# Patient Record
Sex: Male | Born: 1990 | Race: Black or African American | Hispanic: No | Marital: Single | Smoking: Current every day smoker
Health system: Southern US, Community
[De-identification: ages and names within clinical notes are randomized; demographics above are authoritative.]

---
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
## 2013-02-25 ENCOUNTER — Emergency Department: Payer: Self-pay | Admitting: Emergency Medicine

## 2013-04-27 ENCOUNTER — Emergency Department: Payer: Self-pay | Admitting: Internal Medicine

## 2014-01-05 ENCOUNTER — Emergency Department: Payer: Self-pay | Admitting: Emergency Medicine

## 2014-01-05 LAB — BASIC METABOLIC PANEL
ANION GAP: 6 — AB (ref 7–16)
BUN: 7 mg/dL (ref 7–18)
CO2: 29 mmol/L (ref 21–32)
Calcium, Total: 9.2 mg/dL (ref 8.5–10.1)
Chloride: 102 mmol/L (ref 98–107)
Creatinine: 1.1 mg/dL (ref 0.60–1.30)
EGFR (African American): >60
Glucose: 89 mg/dL (ref 65–99)
Osmolality: 271 (ref 275–301)
Potassium: 3.8 mmol/L (ref 3.5–5.1)
Sodium: 137 mmol/L (ref 136–145)

## 2014-01-05 LAB — CBC
HCT: 48.6 % (ref 40.0–52.0)
HGB: 16.7 g/dL (ref 13.0–18.0)
MCH: 31.8 pg (ref 26.0–34.0)
MCHC: 34.2 g/dL (ref 32.0–36.0)
MCV: 93 fL (ref 80–100)
PLATELETS: 222 10*3/uL (ref 150–440)
RBC: 5.24 10*6/uL (ref 4.40–5.90)
RDW: 13.2 % (ref 11.5–14.5)
WBC: 9.3 10*3/uL (ref 3.8–10.6)

## 2014-01-08 LAB — BETA STREP CULTURE(ARMC)

## 2014-11-04 IMAGING — CT CT NECK WITH CONTRAST
4 of 5 series · 16 of 33 positions shown, 18 images · IV contrast (isovue)
Comparison: None.

CLINICAL DATA: Sore throat and trismus.

EXAM:
CT NECK WITH CONTRAST
TECHNIQUE: Multidetector CT imaging of the neck was performed using the
standard protocol following the bolus administration of intravenous
contrast.
CONTRAST:  100 cc Isovue 370 intravenous

[Series 2: axial neck · axial · 0.44mm/px · z∈[-184,-26]mm · 4 of 133 slices shown, 5 images]
[im 27/133  soft-tissue]
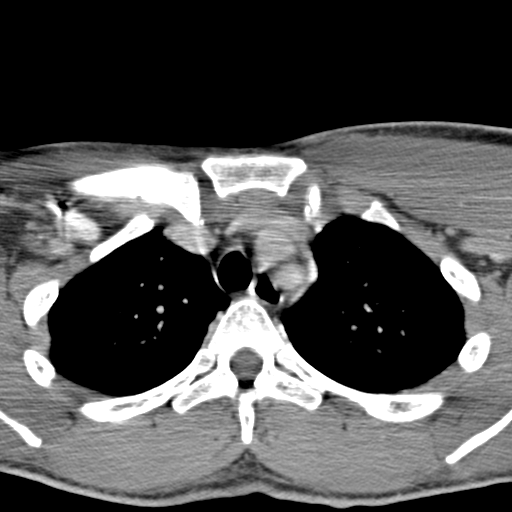
[im 27/133  bone]
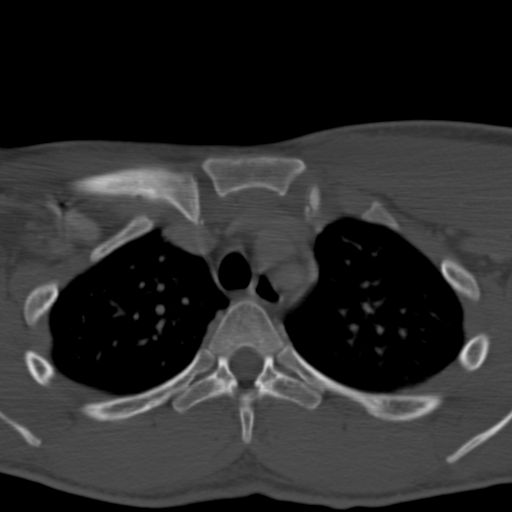
[im 53/133  bone]
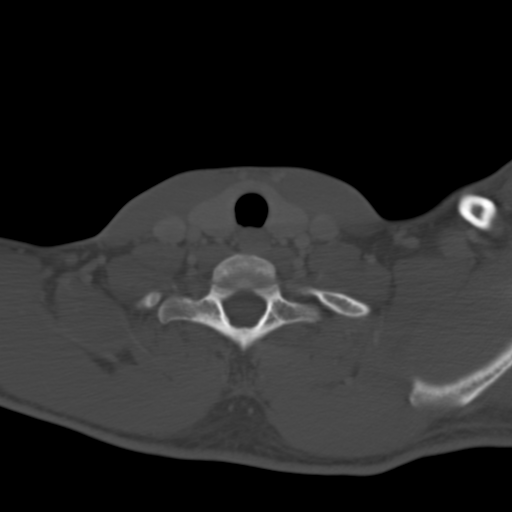
[im 80/133  bone]
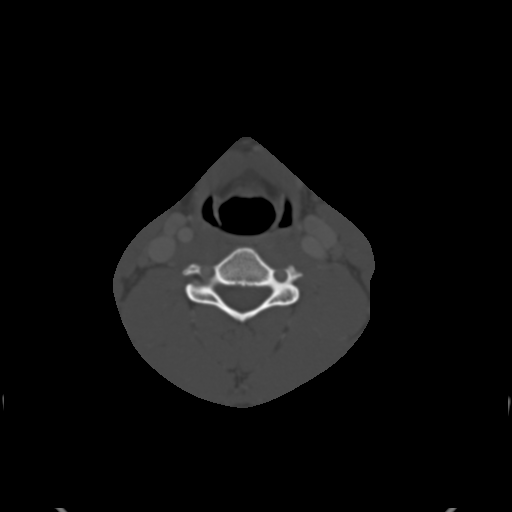
[im 106/133  bone]
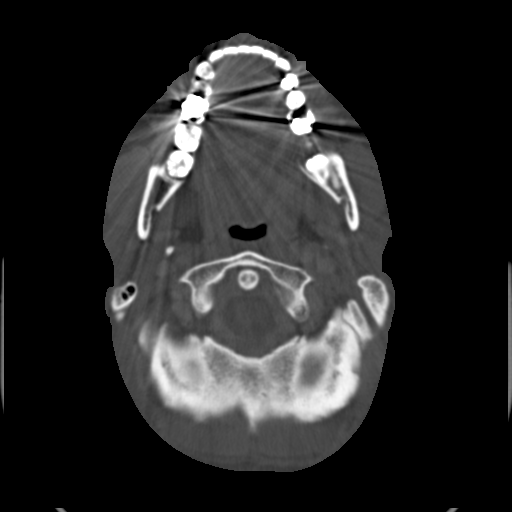

[Series 4: sag neck · sagittal · 0.52mm/px · 5 of 79 slices shown, 6 images]
[im 27/79  bone]
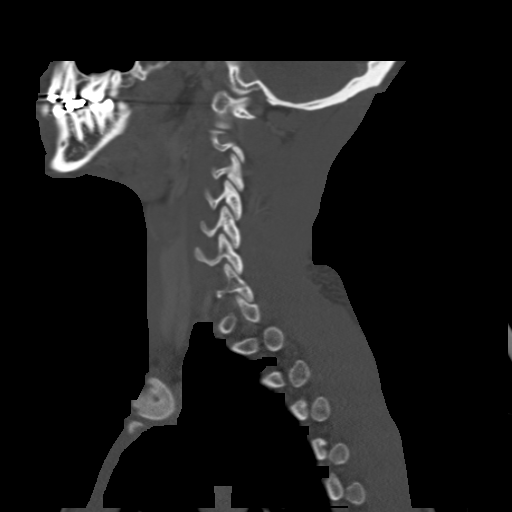
[im 33/79  bone]
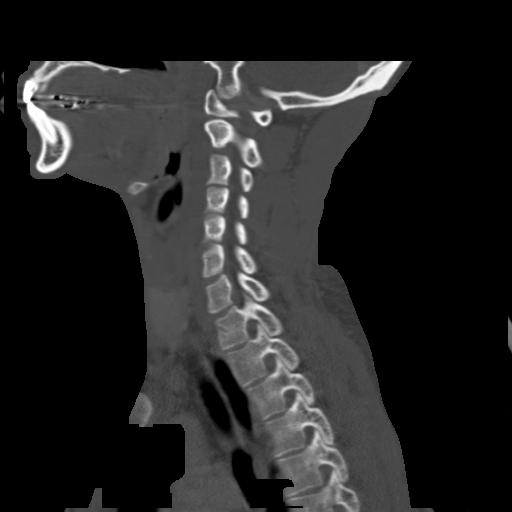
[im 40/79  soft-tissue]
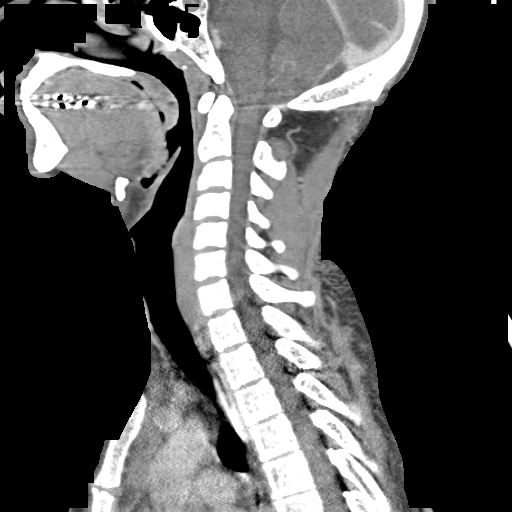
[im 40/79  bone]
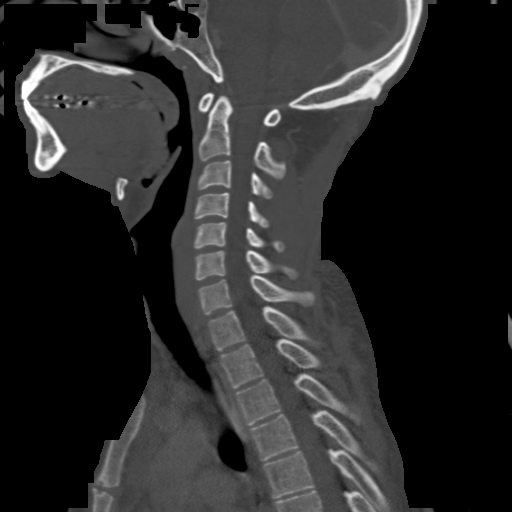
[im 46/79  bone]
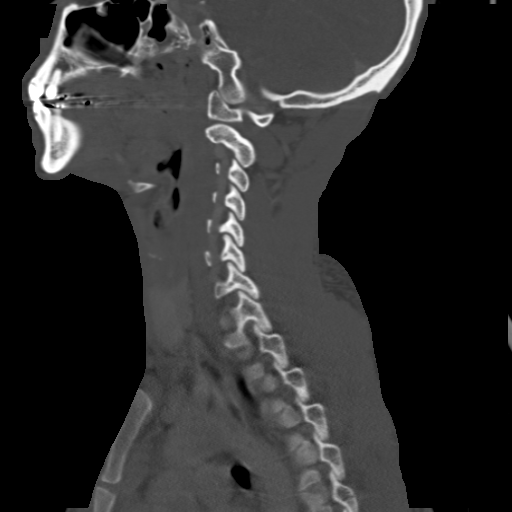
[im 53/79  bone]
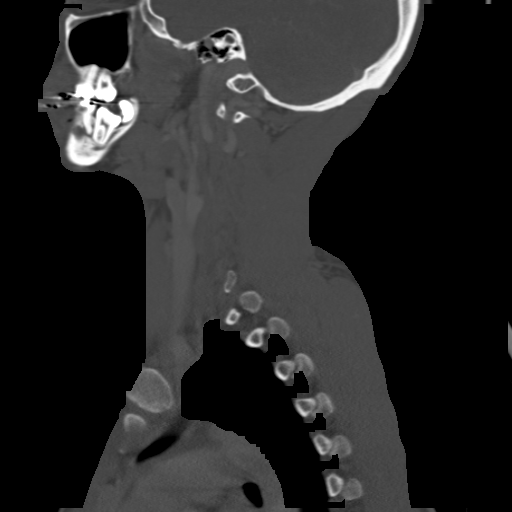

[Series 5: cor neck · coronal · 0.52mm/px · 3 of 100 slices shown]
[im 20/100  bone]
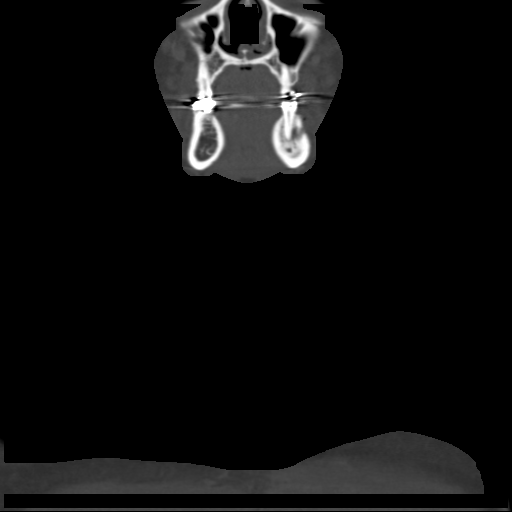
[im 40/100  bone]
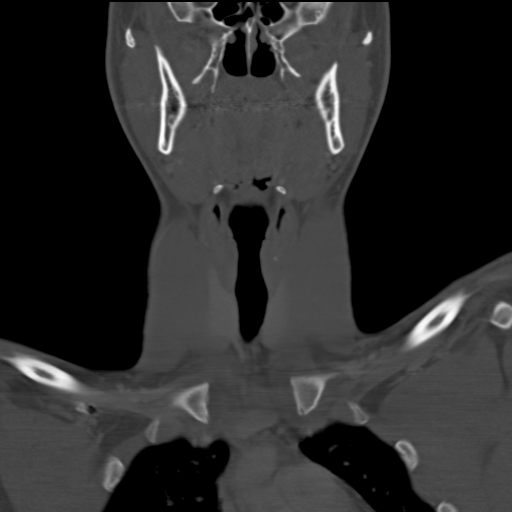
[im 60/100  bone]
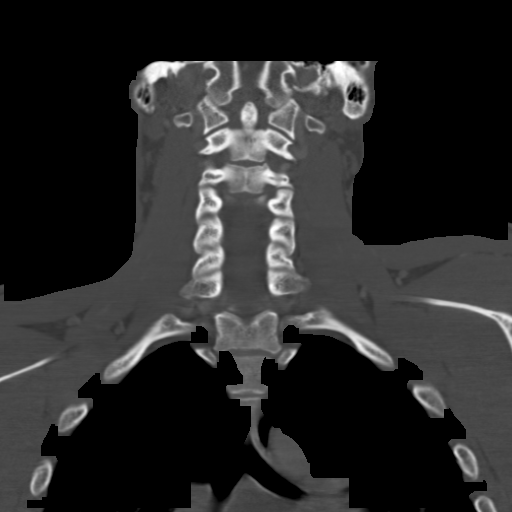

[Series 6: ax oropharynx · axial · 0.40mm/px · z∈[-198,-46]mm · 4 of 132 slices shown]
[im 27/132  bone]
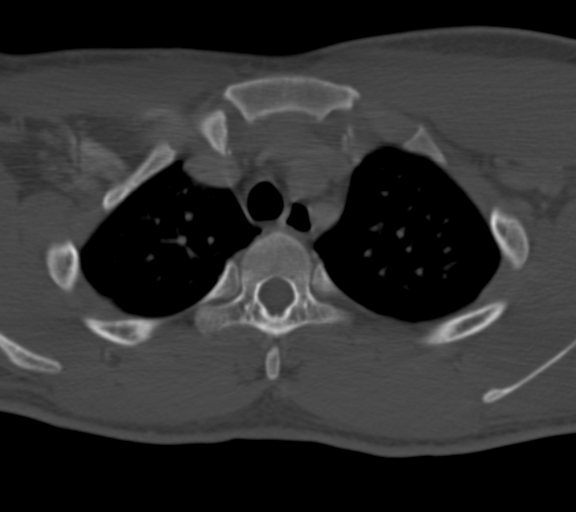
[im 53/132  bone]
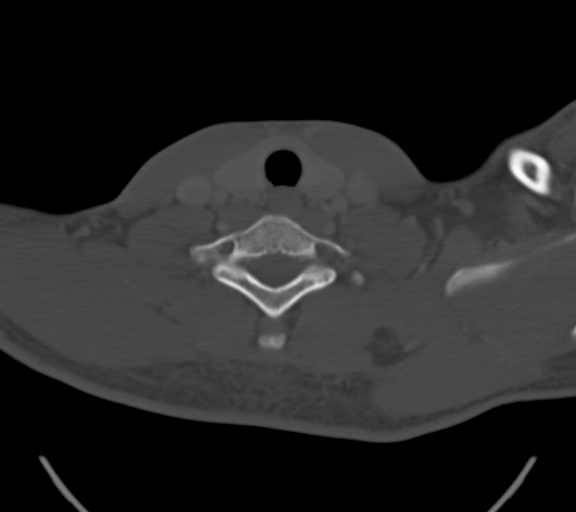
[im 79/132  bone]
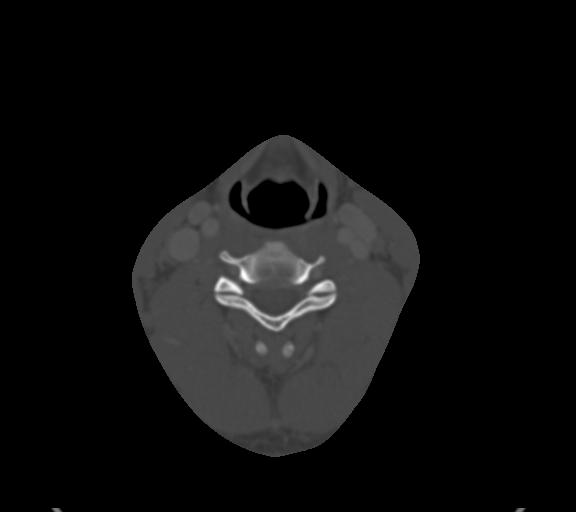
[im 105/132  bone]
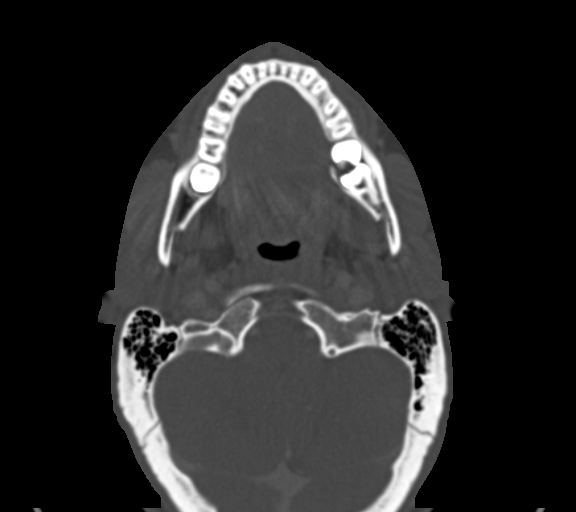

[16 of 33 positions shown; findings below may reference images not displayed]

FINDINGS: There are numerous dental cavities, the largest in the left lower
1st molar, were there is a large periapical erosion which has
extended through the outer cortex of the mandible. Neighboring bone
is sclerotic. No underlying subperiosteal abscess. The overlying
soft tissues are not infiltrated to suggest acute soft tissue
infection

Mild enlargement of the lingual and palatine tonsils which could
represent a pharyngitis/tonsillitis. There is no submucosal
edematous change, abscess, or airway narrowing. No inflammation in
the muscles of mastication to explain trismus. The salivary and
thyroid glands are unremarkable. No adenopathy. Clear apical lungs.
Negative osseous structures.
IMPRESSION: 1. No abscess, epiglottitis, or airway narrowing.
2. Numerous dental caries, the most notable the left lower 1st molar
- which has a periapical abscess and surrounding osteitis.

## 2015-07-04 ENCOUNTER — Encounter (HOSPITAL_COMMUNITY): Payer: Self-pay | Admitting: Emergency Medicine

## 2015-07-04 ENCOUNTER — Emergency Department (HOSPITAL_COMMUNITY)
Admission: EM | Admit: 2015-07-04 | Discharge: 2015-07-04 | Disposition: A | Discharge status: Home / Self Care | Payer: Self-pay | Attending: Emergency Medicine | Admitting: Emergency Medicine

## 2015-07-04 DIAGNOSIS — R6883 Chills (without fever): Secondary | ICD-10-CM | POA: Insufficient documentation

## 2015-07-04 DIAGNOSIS — K088 Other specified disorders of teeth and supporting structures: Secondary | ICD-10-CM | POA: Insufficient documentation

## 2015-07-04 DIAGNOSIS — R5383 Other fatigue: Secondary | ICD-10-CM | POA: Insufficient documentation

## 2015-07-04 DIAGNOSIS — R42 Dizziness and giddiness: Secondary | ICD-10-CM | POA: Insufficient documentation

## 2015-07-04 DIAGNOSIS — K0889 Other specified disorders of teeth and supporting structures: Secondary | ICD-10-CM

## 2015-07-04 MED ORDER — IBUPROFEN 800 MG PO TABS: 800.0000 mg | ORAL_TABLET | Freq: Three times a day (TID) | ORAL | Status: AC

## 2015-07-04 MED ORDER — TRAMADOL HCL 50 MG PO TABS: 50.0000 mg | ORAL_TABLET | Freq: Four times a day (QID) | ORAL | Status: AC | PRN

## 2015-07-04 MED ORDER — PENICILLIN V POTASSIUM 500 MG PO TABS: 500.0000 mg | ORAL_TABLET | Freq: Four times a day (QID) | ORAL | Status: AC

## 2015-07-04 NOTE — ED Notes (Signed)
Pt c/o left lower dental pain since 7/3. States has a broken tooth.

## 2015-07-04 NOTE — ED Provider Notes (Signed)
[id number]     Arrival date & time 07/04/15  1023 History  This chart was scribed for non-physician practitioner, Santiago Glad, PA-C working with Geoffery Lyons, MD by Placido Sou, ED scribe. This patient was seen in room TR06C/TR06C and the patient's care was started at 11:37 AM.    Chief Complaint  Patient presents with  . Dental Pain   The history is provided by the patient. No language interpreter was used.    HPI Comments: Austin Spears is a 24 y.o. male who presents to the Emergency Department complaining of constant, moderate, left-lower dental pain and swelling with onset 3 days ago. He notes that he has a fractured tooth in the affected area and further notes eating candy before the onset of his symptoms. Pt notes associated fatigue, difficulty swallowing due to the pain, and a subjective fever with chills. Pt notes taking 6 ibuprofen with no relief of his symptoms. He denies currently having a dental provider. He denies any nausea or vomiting.  No past medical history on file. No past surgical history on file. No family history on file. History  Substance Use Topics  . Smoking status: Not on file  . Smokeless tobacco: Not on file  . Alcohol Use: Not on file    Review of Systems  Constitutional: Positive for chills and fatigue (subjective).  HENT: Negative for facial swelling and trouble swallowing.   Gastrointestinal: Negative for nausea and vomiting.  Neurological: Positive for dizziness.      Allergies  Review of patient's allergies indicates not on file.  Home Medications   Prior to Admission medications   Not on File   There were no vitals taken for this visit. Physical Exam  Constitutional: He is oriented to person, place, and time. He appears well-developed and well-nourished. No distress.  HENT:  Head: Normocephalic and atraumatic.  Mouth/Throat: Oropharynx is clear and moist. No trismus in the jaw. No dental abscesses.  TTP of left lower  gingiva; no abscess; no sublingual tenderness or swelling; no submandibular lymphadenopathy or swelling; no trismus  Eyes: Conjunctivae and EOM are normal. Pupils are equal, round, and reactive to light.  Neck: Normal range of motion. Neck supple. No tracheal deviation present.  Cardiovascular: Normal rate, regular rhythm and normal heart sounds.  Exam reveals no gallop and no friction rub.   No murmur heard. Pulmonary/Chest: Effort normal and breath sounds normal. No respiratory distress. He has no wheezes. He has no rales.  Abdominal: Soft.  Musculoskeletal: Normal range of motion.  Neurological: He is alert and oriented to person, place, and time.  Skin: Skin is warm and dry.  Psychiatric: He has a normal mood and affect. His behavior is normal.  Nursing note and vitals reviewed.   ED Course  Procedures  DIAGNOSTIC STUDIES: Oxygen Saturation is 100% on RA, normal by my interpretation.    COORDINATION OF CARE: 11:42 AM Discussed treatment plan with pt at bedside and pt agreed to plan.  Labs Review Labs Reviewed - No data to display  Imaging Review No results found.   EKG Interpretation None      MDM   Final diagnoses:  None  Patient with toothache.  No gross abscess.  Exam unconcerning for Ludwig's angina or spread of infection.  Will treat with penicillin.  Urged patient to follow-up with dentist.  Patient referred to on call dentist.  Return precautions given.  I personally performed the services described in this documentation, which was scribed in my presence.  The recorded information has been reviewed and is accurate.    Santiago GladHeather Tobias Avitabile, PA-C 07/04/15 1339  Geoffery Lyonsouglas Delo, MD 07/04/15 [id number]

## 2015-07-04 NOTE — Discharge Instructions (Signed)
You have a dental injury. Use the resource guide listed below to help you find a dentist if you do not already have one to followup with. It is very important that you get evaluated by a dentist as soon as possible. Call tomorrow to schedule an appointment. Use your pain medication as prescribed and do not operate heavy machinery while on pain medication. Take your full course of antibiotics. Read the instructions below. ° °Eat a soft or liquid diet and rinse your mouth out after meals with warm water. You should see a dentist or return here at once if you have increased swelling, increased pain or uncontrolled bleeding from the site of your injury. ° ° °SEEK MEDICAL CARE IF:  °· You have increased pain not controlled with medicines.  °· You have swelling around your tooth, in your face or neck.  °· You have bleeding which starts, continues, or gets worse.  °· You have a fever >101 °· If you are unable to open your mouth ° °RESOURCE GUIDE ° °Dental Problems ° °Patients with Medicaid: °[facility]                     [facility] °[address]                                           [address] °Phone:  [phone]                                                  Phone:  [phone] ° °If unable to pay or uninsured, contact:  [facility] or [facility] to become qualified for the adult dental clinic. ° °Chronic Pain Problems °Contact [facility]  [phone] °Patients need to be referred by their primary care doctor. ° °Insufficient Money for Medicine °Contact [employer]:  call "211" or [facility] 271-5999. ° °No Primary Care Doctor °Call [facility]  832-8000 °Other agencies that provide inexpensive medical care °   [facility]  [phone] °   [facility]  832-7272 °   [facility]  [phone] °   [facility]  832-4777 °   [facility]  [phone] °   [facility]   [phone] ° °Psychological Services °[facility]  [phone] °[facility]  [phone] °[facility]   [phone] (emergency services 641-4993) ° °Substance Abuse Resources °[facility]  336-882-2125 °[facility] 336-784-9470 °[facility] 336-285-9073 °[facility] 336-845-3988 °Residential & Outpatient Substance Abuse Program  800-659-3381 ° °Abuse/Neglect °[place] [facility] (336) 641-3795 °[place] [facility] 800-378-5315 (After Hours) ° °Emergency Shelter °[facility] (336) 271-5985 ° °Maternity Homes °[facility] (336) 275-9566 °[facility] (704) 372-4663 ° °MRSA Hotline #:   832-7006 ° ° ° °[place] [facility] ° °[facility] [facility]     [employer]                          [facility] °[address]                          [address]      [address]  °[place]                                                [place]                            [place] °Phone:  349-3220                                   Phone:  [phone]                 Phone:  342-8140 ° °[facility] °Phone:  342-8316 ° °[place] [facility] °(336) 342-1394 °(336) 342-3537 (After Hours) ° ° ° ° °
